# Patient Record
Sex: Female | Born: 1975 | Race: White | Hispanic: No | Marital: Married | Smoking: Never smoker
Health system: Southern US, Community
[De-identification: ages and names within clinical notes are randomized; demographics above are authoritative.]

## PROBLEM LIST (undated history)

## (undated) HISTORY — PX: NO PAST SURGERIES: SHX2092

---

## 2018-08-25 ENCOUNTER — Other Ambulatory Visit: Payer: Self-pay | Admitting: Gastroenterology

## 2018-08-25 DIAGNOSIS — R109 Unspecified abdominal pain: Secondary | ICD-10-CM

## 2018-09-02 ENCOUNTER — Ambulatory Visit
Admission: RE | Admit: 2018-09-02 | Discharge: 2018-09-02 | Disposition: A | Discharge status: Home / Self Care | Payer: Managed Care, Other (non HMO) | Source: Ambulatory Visit | Attending: Gastroenterology | Admitting: Gastroenterology

## 2018-09-02 ENCOUNTER — Other Ambulatory Visit: Payer: Self-pay

## 2018-09-02 DIAGNOSIS — R109 Unspecified abdominal pain: Secondary | ICD-10-CM | POA: Diagnosis not present

## 2018-09-02 MED ORDER — IOHEXOL 300 MG/ML  SOLN
100.0000 mL | Freq: Once | INTRAMUSCULAR | Status: AC | PRN
  Administered 2018-09-02: 16:00:00 100 mL via INTRAVENOUS

## 2019-08-06 ENCOUNTER — Other Ambulatory Visit: Payer: Self-pay | Admitting: Obstetrics and Gynecology

## 2019-08-06 DIAGNOSIS — Z1231 Encounter for screening mammogram for malignant neoplasm of breast: Secondary | ICD-10-CM

## 2020-05-04 IMAGING — CT CT ABDOMEN AND PELVIS WITH CONTRAST
2 of 5 series · 16 of 46 positions shown, 18 images · IV contrast (omnipaque)
Comparison: None.

CLINICAL DATA: Abdominal pain, primarily left-sided

EXAM:
CT ABDOMEN AND PELVIS WITH CONTRAST
TECHNIQUE: Multidetector CT imaging of the abdomen and pelvis was performed
using the standard protocol following bolus administration of
intravenous contrast. Oral contrast was also administered.
CONTRAST:  100mL OMNIPAQUE IOHEXOL 300 MG/ML  SOLN

[Series 2: abd pelvis · axial · 0.74mm/px · z∈[-1468,-1073]mm · 13 of 89 slices shown, 15 images]
[im 5/89  soft-tissue]
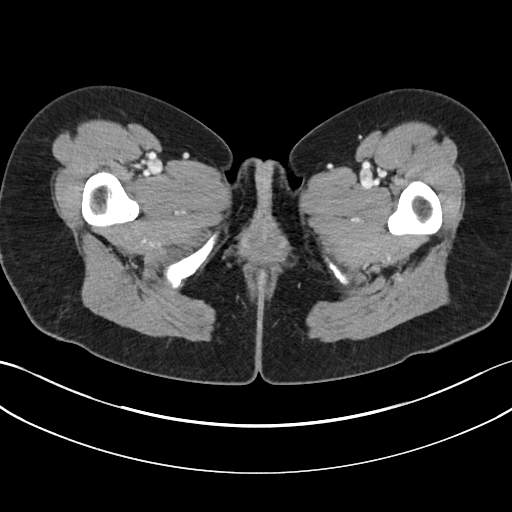
[im 5/89  bone]
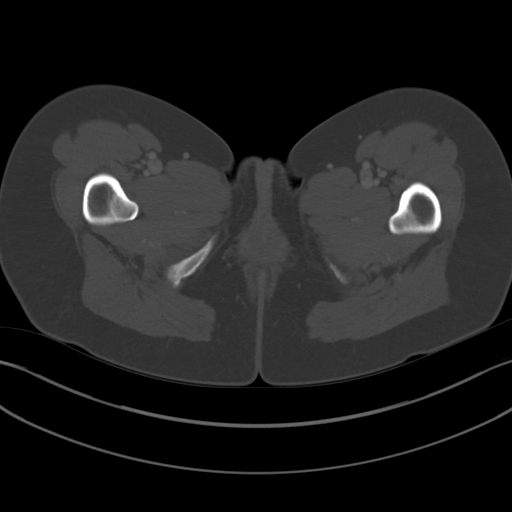
[im 10/89  soft-tissue]
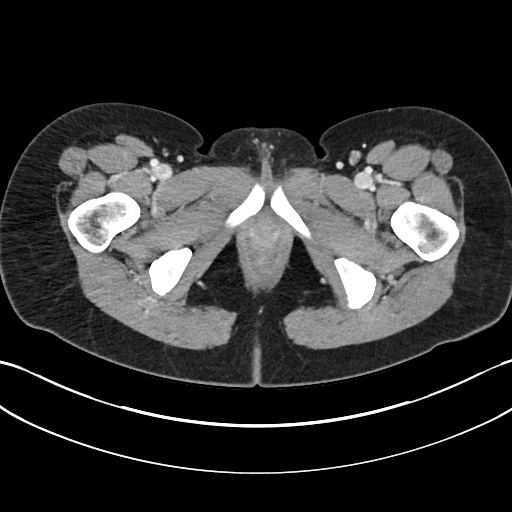
[im 20/89  soft-tissue]
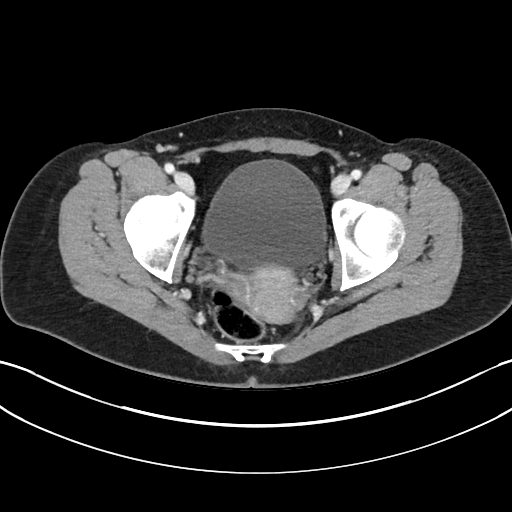
[im 25/89  soft-tissue]
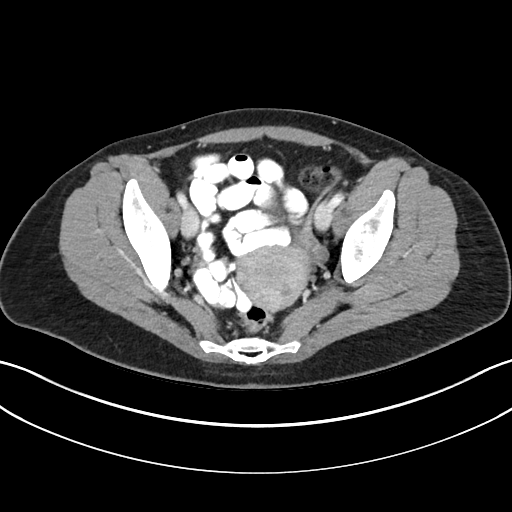
[im 30/89  soft-tissue]
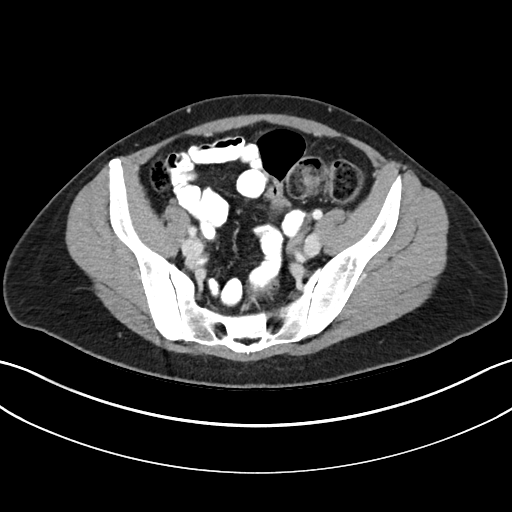
[im 40/89  soft-tissue]
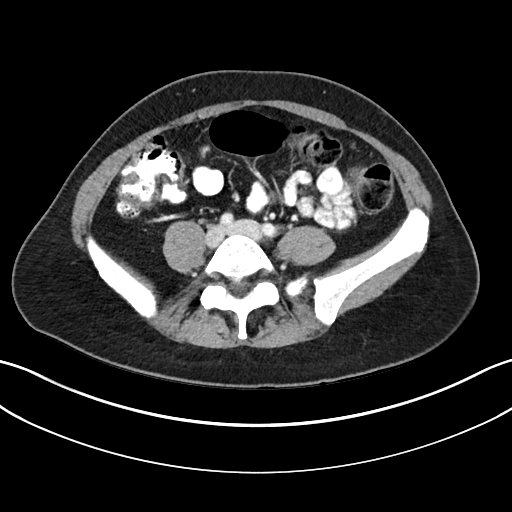
[im 45/89  soft-tissue]
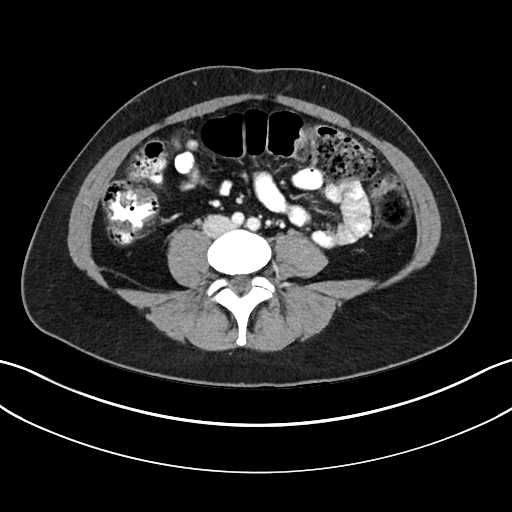
[im 49/89  soft-tissue]
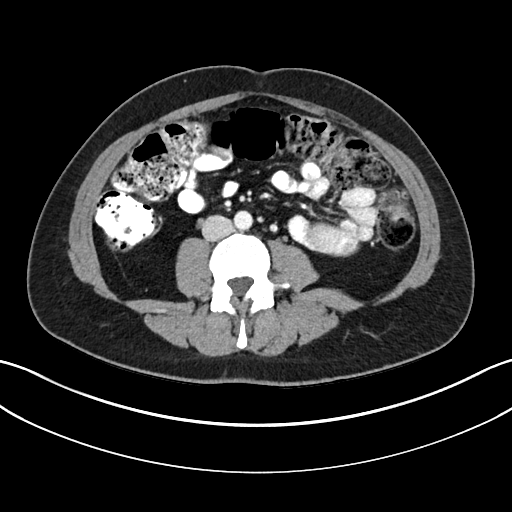
[im 59/89  soft-tissue]
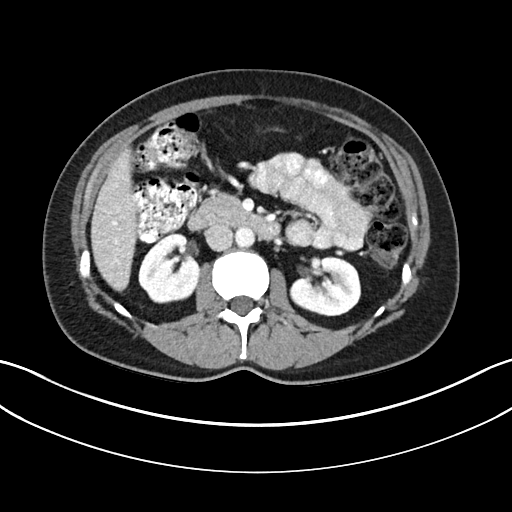
[im 59/89  bone]
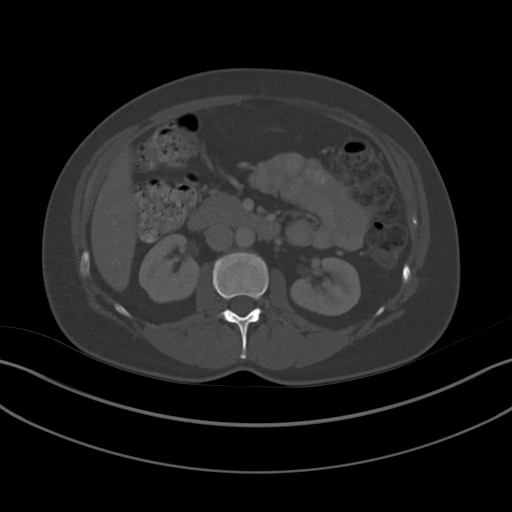
[im 64/89  soft-tissue]
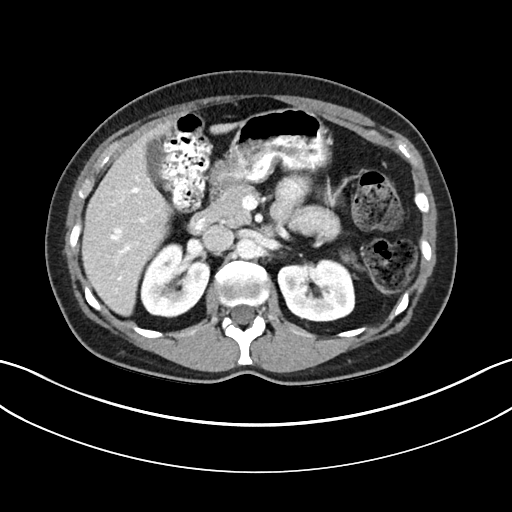
[im 69/89  soft-tissue]
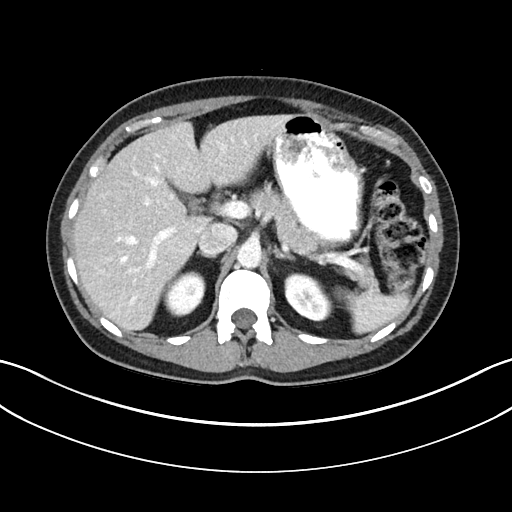
[im 79/89  soft-tissue]
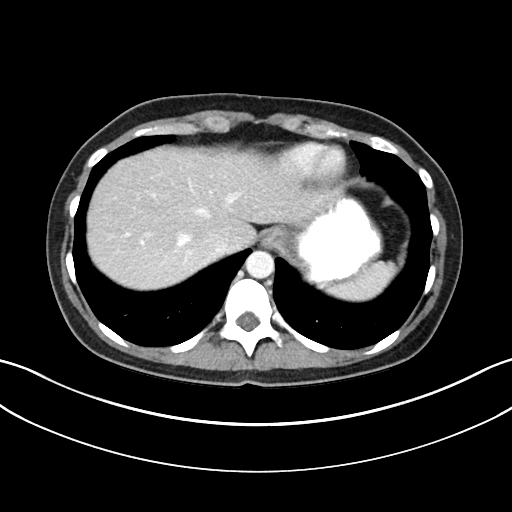
[im 84/89  soft-tissue]
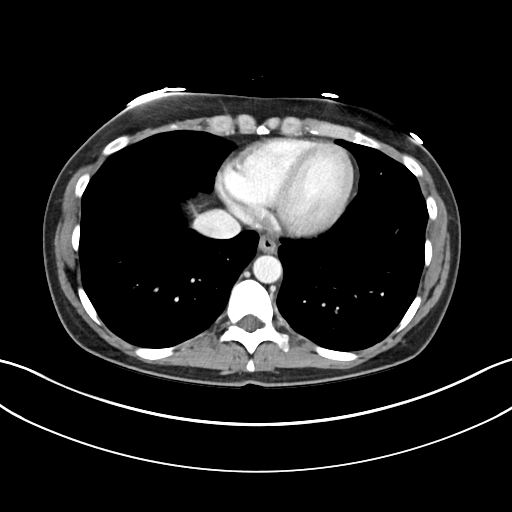

[Series 4: coronal abd pelvis · coronal · 0.74mm/px · 3 of 121 slices shown]
[im 41/121  soft-tissue]
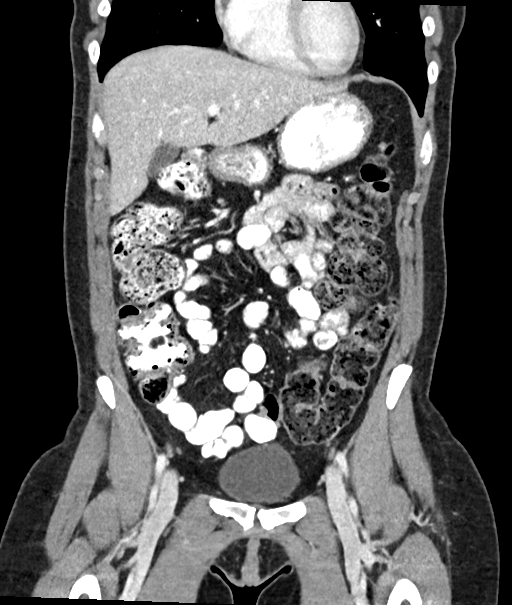
[im 54/121  soft-tissue]
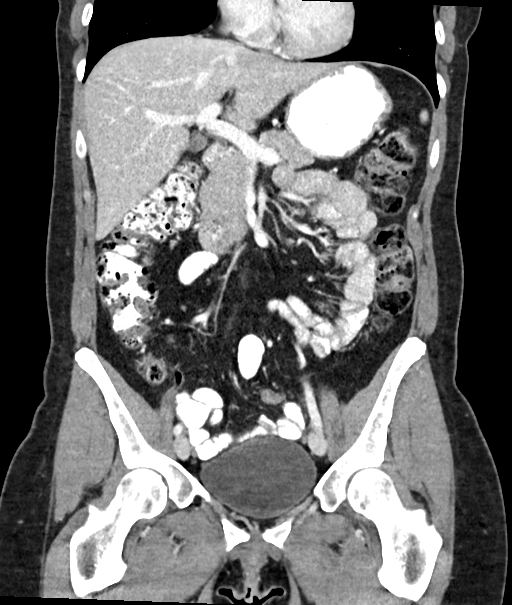
[im 67/121  soft-tissue]
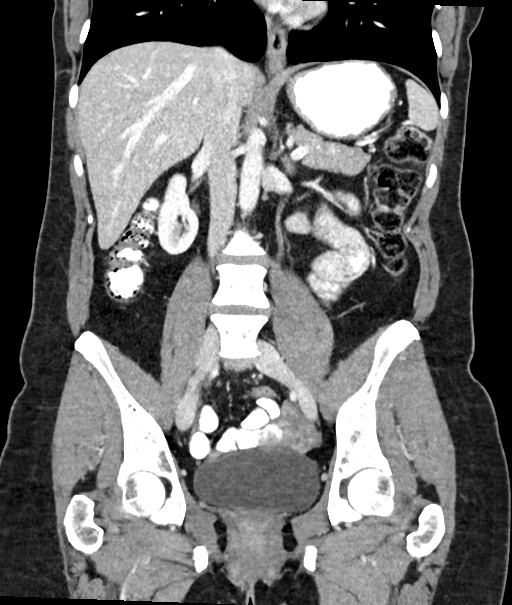

[16 of 46 positions shown; findings below may reference images not displayed]

FINDINGS: Lower chest: Lung bases are clear.

Hepatobiliary: No focal liver lesions are evident. Gallbladder wall
is not appreciably thickened. There is no biliary duct dilatation.

Pancreas: There is no pancreatic mass or inflammatory focus.

Spleen: No splenic lesions are evident.

Adrenals/Urinary Tract: Adrenals appear normal bilaterally. There is
a 5 mm probable cyst in the posterior lower pole left kidney. There
is no evident hydronephrosis on either side. There is no renal or
ureteral calculus on either side. Urinary bladder is midline with
wall thickness within normal limits.

Stomach/Bowel: There is fairly diffuse stool throughout the colon.
There is no appreciable bowel wall or mesenteric thickening. There
is no evident bowel obstruction. Terminal ileum appears
unremarkable. There is no evident free air or portal venous air.

Vascular/Lymphatic: There is no abdominal aortic aneurysm. No
vascular lesions are evident. There is no adenopathy in the abdomen
or pelvis.

Reproductive: Uterus is anteverted.  No evident pelvic mass.

Other: Appendix appears unremarkable. No evident abscess or ascites
in the abdomen or pelvis.

Musculoskeletal: No blastic or lytic bone lesions. There is no
intramuscular or abdominal wall lesions.
IMPRESSION: 1. Fairly diffuse stool throughout colon. No bowel obstruction. No
appreciable diverticulitis. No abscess in the abdomen or pelvis.
Appendix appears normal.

2. No evident renal or ureteral calculus. No hydronephrosis. Urinary
bladder wall thickness within normal limits.

Comment: A cause for patient's symptoms has not been established
with this study.

## 2021-09-21 ENCOUNTER — Ambulatory Visit: Payer: Managed Care, Other (non HMO) | Admitting: Family

## 2021-09-21 ENCOUNTER — Encounter: Payer: Self-pay | Admitting: Family

## 2021-09-21 VITALS — BP 140/90 | HR 77 | Temp 98.3°F | Resp 16 | Ht 63.25 in | Wt 155.5 lb

## 2021-09-21 DIAGNOSIS — Z1231 Encounter for screening mammogram for malignant neoplasm of breast: Secondary | ICD-10-CM | POA: Diagnosis not present

## 2021-09-21 DIAGNOSIS — Z13220 Encounter for screening for lipoid disorders: Secondary | ICD-10-CM

## 2021-09-21 DIAGNOSIS — Z1211 Encounter for screening for malignant neoplasm of colon: Secondary | ICD-10-CM

## 2021-09-21 DIAGNOSIS — R030 Elevated blood-pressure reading, without diagnosis of hypertension: Secondary | ICD-10-CM

## 2021-09-21 DIAGNOSIS — L659 Nonscarring hair loss, unspecified: Secondary | ICD-10-CM

## 2021-09-21 DIAGNOSIS — B009 Herpesviral infection, unspecified: Secondary | ICD-10-CM

## 2021-09-21 MED ORDER — VALACYCLOVIR HCL 1 G PO TABS: ORAL_TABLET | ORAL | 0 refills | Status: DC

## 2021-09-21 NOTE — Assessment & Plan Note (Signed)
rx valtrex 1 g  Prn outbreaks

## 2021-09-21 NOTE — Assessment & Plan Note (Signed)
Pt refuses colonoscopy for now, prefers cologuard. (may consider colonoscopy in the future) ?No family history colon cancer to their knowledge. ?Pt denies abdominal symptoms to include diarrhea, constipation or blood in stool. ?cologuard order placed. ? ?

## 2021-09-21 NOTE — Assessment & Plan Note (Signed)
Lipid panel ordered pending results.   

## 2021-09-21 NOTE — Assessment & Plan Note (Signed)
Ordering cbc and tsh

## 2021-09-21 NOTE — Patient Instructions (Addendum)
I have sent an electronic order over to your preferred location for the following:   []   2D Mammogram  [x]   3D Mammogram  []   Bone Density   Please give this center a call to get scheduled at your convenience.  [x]   [facility]  [address] [place] [facility]  [phone]  Make sure to wear two piece  clothing  No lotions powders or deodorants the day of the appointment Make sure to bring picture ID and insurance card.  Bring list of medications you are currently taking including any supplements.   Sent out cologuard will arrive at your house.   Welcome to our clinic, I am happy to have you as my new patient. I am excited to continue on this healthcare journey with you.  I have created an order for lab work today during our visit.  Please schedule an appointment on your way out to return to the lab at your convenience. Please return fasting at your lab appointment (meaning you can only drink black coffee and or water prior to your appointment). I will reach out to you in regards to the labs when I receive the results.   Please keep in mind Any my chart messages you send have up to a three business day turnaround for a response.  Phone calls may take up to a one full business day turnaround for a  response.   If you need a medication refill I recommend you request it through the pharmacy as this is easiest for [facility] rather than sending a message and or phone call.   Due to recent changes in healthcare laws, you may see results of your imaging and/or laboratory studies on MyChart before I have had a chance to review them.  I understand that in some cases there may be results that are confusing or concerning to you. Please understand that not all results are received at the same time and often I may need to interpret multiple results in order to provide you with the best plan of care or course of treatment. Therefore, I ask  that you please give me 2 business days to thoroughly review all your results before contacting my office for clarification. Should we see a critical lab result, you will be contacted sooner.   It was a pleasure seeing you today! Please do not hesitate to reach out with any questions and or concerns.  Regards,   [place] FNP-C

## 2021-09-21 NOTE — Assessment & Plan Note (Signed)
Start monitoring your blood pressure daily, around the same time of day, for the next 2-3 weeks.  Ensure that you have rested for 30 minutes prior to checking your blood pressure. Record your readings and bring them to your next visit.  

## 2021-09-21 NOTE — Progress Notes (Signed)
New Patient Office Visit  Subjective:  Patient ID: Amanda Cummings, female    DOB: 04-Dec-1975  Age: 46 y.o. MRN: [id number]  CC:  Chief Complaint  Patient presents with   Establish Care    HPI Amanda Cummings is here to establish care as a new patient.  Prior provider was: MD in 2021 in [place]. He retired.  Pt is without acute concerns.   Td , overdue. Will get at pharmacy per pt.  Pap smear: 2022, negative. Every three years. Has gyn.   Mammogram: overdue, last in 2019.  Colonoscopy: doesn't want this, but will do the colo-guard.   Elevated bp w/o htn: no cp palp and or sob. No swelling in the legs. Did check blood pressure at home, morning is good at night 140/80 or so. No headache. No blurry vision.  Has had ekg in the past in his office. NSR.    Has noticed losing some hair, thinning hair.  Does not have increased fatigue, does not feel tired often.  No joint pains.   Lmp: regular monthly periods doing very well. Not overly heavy. Medium flow.   History reviewed. No pertinent past medical history.  Past Surgical History:  Procedure Laterality Date   NO PAST SURGERIES      Family History  Problem Relation Age of Onset   Hypertension Mother    Heart disease Mother    Hyperlipidemia Father    Heart attack Maternal Grandmother    Diabetes Paternal Grandmother    Heart disease Paternal Grandfather     Social History   Socioeconomic History   Marital status: Married    Spouse name: Not on file   Number of children: Not on file   Years of education: Not on file   Highest education level: Not on file  Occupational History    Employer: [employer]    CommentCommunity education officer  Tobacco Use   Smoking status: Never   Smokeless tobacco: Never  Vaping Use   Vaping Use: Never used  Substance and Sexual Activity   Alcohol use: Yes    Alcohol/week: 5.0 standard drinks of alcohol    Types: 5 Glasses of wine per week   Drug use: Never   Sexual activity: Yes    Partners:  Female  Other Topics Concern   Not on file  Social History Narrative   Not on file   Social Determinants of Health   Financial Resource Strain: Not on file  Food Insecurity: Not on file  Transportation Needs: Not on file  Physical Activity: Not on file  Stress: Not on file  Social Connections: Not on file  Intimate Partner Violence: Not on file    No outpatient medications prior to visit.   No facility-administered medications prior to visit.    No Known Allergies  ROS Review of Systems  Review of Systems  Respiratory:  Negative for shortness of breath.   Cardiovascular:  Negative for chest pain and palpitations.  Gastrointestinal:  Negative for constipation and diarrhea.  Genitourinary:  Negative for dysuria, frequency and urgency.  Musculoskeletal:  Negative for myalgias.  Psychiatric/Behavioral:  Negative for depression and suicidal ideas.   All other systems reviewed and are negative.    Objective:    Physical Exam  Gen: NAD, resting comfortably CV: RRR with no murmurs appreciated Pulm: NWOB, CTAB with no crackles, wheezes, or rhonchi Skin: warm, dry Psych: Normal affect and thought content  BP (!) 144/78   Pulse 77   Temp 98.3 F (  36.8 C)   Resp 16   Ht 5' 3.25" (1.607 m)   Wt 155 lb 8 oz (70.5 kg)   LMP 09/19/2021   SpO2 99%   BMI 27.33 kg/m  Wt Readings from Last 3 Encounters:  09/21/21 155 lb 8 oz (70.5 kg)     Health Maintenance Due  Topic Date Due   HIV Screening  Never done   Hepatitis C Screening  Never done   TETANUS/TDAP  Never done   PAP SMEAR-Modifier  Never done   COLONOSCOPY (Pts 45-55yr Insurance coverage will need to be confirmed)  Never done   INFLUENZA VACCINE  09/05/2021    There are no preventive care reminders to display for this patient.  No results found for: "TSH" No results found for: "WBC", "HGB", "HCT", "MCV", "PLT" No results found for: "NA", "K", "CHLORIDE", "CO2", "GLUCOSE", "BUN", "CREATININE", "BILITOT",  "ALKPHOS", "AST", "ALT", "PROT", "ALBUMIN", "CALCIUM", "ANIONGAP", "EGFR", "GFR" No results found for: "CHOL" No results found for: "HDL" No results found for: "LDLCALC" No results found for: "TRIG" No results found for: "CHOLHDL" No results found for: "HGBA1C"    Assessment & Plan:   Problem List Items Addressed This Visit       Other   Screening for colon cancer    Pt refuses colonoscopy for now, prefers cologuard. (may consider colonoscopy in the future) No family history colon cancer to their knowledge. Pt denies abdominal symptoms to include diarrhea, constipation or blood in stool. cologuard order placed.       Relevant Orders   Cologuard   Encounter for screening mammogram for malignant neoplasm of breast - Primary    Mammogram ordered. Pending results.       Relevant Orders   MM 3D SCREEN BREAST BILATERAL   Screening for lipoid disorders    Lipid panel ordered pending results.        Relevant Orders   Comprehensive metabolic panel   Lipid panel   Herpes simplex type 1 infection    rx valtrex 1 g  Prn outbreaks      Relevant Medications   valACYclovir (VALTREX) 1000 MG tablet   Elevated blood pressure reading without diagnosis of hypertension    Start monitoring your blood pressure daily, around the same time of day, for the next 2-3 weeks.  Ensure that you have rested for 30 minutes prior to checking your blood pressure. Record your readings and bring them to your next visit.       Relevant Orders   Comprehensive metabolic panel   Thinning hair    Ordering cbc and tsh       Relevant Orders   CBC with Differential/Platelet   TSH    Meds ordered this encounter  Medications   DISCONTD: valACYclovir (VALTREX) 1000 MG tablet    Sig: Take two tablets po bid for one dose prn outbreak    Dispense:  30 tablet    Refill:  0    Order Specific Question:   Supervising Provider    Answer:   BEDSOLE, AMY E [[id number]]   valACYclovir (VALTREX) 1000 MG tablet     Sig: Take two tablets po bid for one dose prn outbreak    Dispense:  30 tablet    Refill:  0    Order Specific Question:   Supervising Provider    Answer:   BEDSOLE, AMY E [[id number]]    Follow-up: Return in about 1 month (around 10/22/2021) for follow up on blood pressure.  Eugenia Pancoast, FNP

## 2021-09-21 NOTE — Assessment & Plan Note (Signed)
Mammogram ordered. Pending results. 

## 2021-09-27 ENCOUNTER — Other Ambulatory Visit (INDEPENDENT_AMBULATORY_CARE_PROVIDER_SITE_OTHER): Payer: Managed Care, Other (non HMO)

## 2021-09-27 DIAGNOSIS — L659 Nonscarring hair loss, unspecified: Secondary | ICD-10-CM

## 2021-09-27 DIAGNOSIS — R030 Elevated blood-pressure reading, without diagnosis of hypertension: Secondary | ICD-10-CM

## 2021-09-27 DIAGNOSIS — Z13220 Encounter for screening for lipoid disorders: Secondary | ICD-10-CM | POA: Diagnosis not present

## 2021-09-27 NOTE — Addendum Note (Signed)
Addended by: Alvina Chou on: 09/27/2021 08:00 AM   Modules accepted: Orders

## 2021-09-28 LAB — CBC WITH DIFFERENTIAL/PLATELET
Basophils Absolute: 0 10*3/uL (ref 0.0–0.2)
Basos: 1 %
EOS (ABSOLUTE): 0.2 10*3/uL (ref 0.0–0.4)
Eos: 2 %
Hematocrit: 44 % (ref 34.0–46.6)
Hemoglobin: 14.7 g/dL (ref 11.1–15.9)
Immature Grans (Abs): 0 10*3/uL (ref 0.0–0.1)
Immature Granulocytes: 0 %
Lymphocytes Absolute: 2.1 10*3/uL (ref 0.7–3.1)
Lymphs: 30 %
MCH: 29.2 pg (ref 26.6–33.0)
MCHC: 33.4 g/dL (ref 31.5–35.7)
MCV: 88 fL (ref 79–97)
Monocytes Absolute: 0.6 10*3/uL (ref 0.1–0.9)
Monocytes: 8 %
Neutrophils Absolute: 4.3 10*3/uL (ref 1.4–7.0)
Neutrophils: 59 %
Platelets: 392 10*3/uL (ref 150–450)
RBC: 5.03 x10E6/uL (ref 3.77–5.28)
RDW: 12.7 % (ref 11.7–15.4)
WBC: 7.2 10*3/uL (ref 3.4–10.8)

## 2021-09-28 LAB — LIPID PANEL
Chol/HDL Ratio: 4.4 ratio (ref 0.0–4.4)
Cholesterol, Total: 212 mg/dL — ABNORMAL HIGH (ref 100–199)
HDL: 48 mg/dL (ref >39)
LDL Chol Calc (NIH): 138 mg/dL — ABNORMAL HIGH (ref 0–99)
Triglycerides: 146 mg/dL (ref 0–149)
VLDL Cholesterol Cal: 26 mg/dL (ref 5–40)

## 2021-09-28 LAB — COMPREHENSIVE METABOLIC PANEL
ALT: 24 IU/L (ref 0–32)
AST: 22 IU/L (ref 0–40)
Albumin/Globulin Ratio: 1.7 (ref 1.2–2.2)
Albumin: 4.4 g/dL (ref 3.9–4.9)
Alkaline Phosphatase: 110 IU/L (ref 44–121)
BUN/Creatinine Ratio: 13 (ref 9–23)
BUN: 11 mg/dL (ref 6–24)
Bilirubin Total: 0.5 mg/dL (ref 0.0–1.2)
CO2: 21 mmol/L (ref 20–29)
Calcium: 9.2 mg/dL (ref 8.7–10.2)
Chloride: 103 mmol/L (ref 96–106)
Creatinine, Ser: 0.86 mg/dL (ref 0.57–1.00)
Globulin, Total: 2.6 g/dL (ref 1.5–4.5)
Glucose: 99 mg/dL (ref 70–99)
Potassium: 4.4 mmol/L (ref 3.5–5.2)
Sodium: 141 mmol/L (ref 134–144)
Total Protein: 7 g/dL (ref 6.0–8.5)
eGFR: 84 mL/min/{1.73_m2} (ref >59)

## 2021-09-28 LAB — TSH: TSH: 1.63 u[IU]/mL (ref 0.450–4.500)

## 2021-10-02 NOTE — Progress Notes (Signed)
Make sure pt schedules one month follow up for blood pressure.  Also with high cholesterol. LDL is 138, goal <70.  Work on low cholesterol diet. Exercise as tolerated.  Will repeat in three months. Otherwise labs are stable

## 2021-11-06 LAB — COLOGUARD: COLOGUARD: NEGATIVE

## 2022-11-05 ENCOUNTER — Other Ambulatory Visit: Payer: Self-pay | Admitting: Family

## 2022-11-05 DIAGNOSIS — B009 Herpesviral infection, unspecified: Secondary | ICD-10-CM

## 2022-11-06 ENCOUNTER — Other Ambulatory Visit: Payer: Self-pay | Admitting: *Deleted

## 2022-11-06 DIAGNOSIS — B009 Herpesviral infection, unspecified: Secondary | ICD-10-CM

## 2024-03-18 ENCOUNTER — Ambulatory Visit: Admitting: Family
